# Patient Record
Sex: Male | Born: 1975 | Race: White | Hispanic: No | Marital: Single | Smoking: Never smoker
Health system: Southern US, Community
[De-identification: ages and names within clinical notes are randomized; demographics above are authoritative.]

## PROBLEM LIST (undated history)

## (undated) DIAGNOSIS — F419 Anxiety disorder, unspecified: Secondary | ICD-10-CM

## (undated) DIAGNOSIS — N200 Calculus of kidney: Secondary | ICD-10-CM

## (undated) HISTORY — PX: HERNIA REPAIR: SHX51

---

## 2010-10-28 ENCOUNTER — Emergency Department: Payer: Self-pay | Admitting: Emergency Medicine

## 2011-01-24 ENCOUNTER — Emergency Department: Payer: Self-pay | Admitting: Emergency Medicine

## 2018-04-20 ENCOUNTER — Ambulatory Visit
Admission: EM | Admit: 2018-04-20 | Discharge: 2018-04-20 | Disposition: A | Discharge status: Home / Self Care | Payer: 59 | Attending: Emergency Medicine | Admitting: Emergency Medicine

## 2018-04-20 ENCOUNTER — Other Ambulatory Visit: Payer: Self-pay

## 2018-04-20 ENCOUNTER — Encounter: Payer: Self-pay | Admitting: Emergency Medicine

## 2018-04-20 DIAGNOSIS — N200 Calculus of kidney: Secondary | ICD-10-CM | POA: Diagnosis not present

## 2018-04-20 DIAGNOSIS — N23 Unspecified renal colic: Secondary | ICD-10-CM | POA: Insufficient documentation

## 2018-04-20 HISTORY — DX: Anxiety disorder, unspecified: F41.9

## 2018-04-20 LAB — URINALYSIS, COMPLETE (UACMP) WITH MICROSCOPIC
BACTERIA UA: NONE SEEN
Glucose, UA: NEGATIVE mg/dL
KETONES UR: 15 mg/dL — AB
LEUKOCYTES UA: NEGATIVE
Nitrite: NEGATIVE
PH: 6 (ref 5.0–8.0)
Protein, ur: 30 mg/dL — AB
SQUAMOUS EPITHELIAL / LPF: NONE SEEN (ref 0–5)
Specific Gravity, Urine: 1.02 (ref 1.005–1.030)

## 2018-04-20 MED ORDER — IBUPROFEN 600 MG PO TABS: 600.0000 mg | ORAL_TABLET | Freq: Four times a day (QID) | ORAL | 0 refills | Status: DC | PRN

## 2018-04-20 MED ORDER — ONDANSETRON 8 MG PO TBDP: ORAL_TABLET | ORAL | 0 refills | Status: AC

## 2018-04-20 MED ORDER — HYDROCODONE-ACETAMINOPHEN 5-325 MG PO TABS: 1.0000 | ORAL_TABLET | Freq: Four times a day (QID) | ORAL | 0 refills | Status: DC | PRN

## 2018-04-20 MED ORDER — TAMSULOSIN HCL 0.4 MG PO CAPS: 0.4000 mg | ORAL_CAPSULE | Freq: Every day | ORAL | 0 refills | Status: AC

## 2018-04-20 NOTE — Discharge Instructions (Addendum)
Zofran for nausea and vomiting, take ibuprofen 600 mg with 1 g of Tylenol together 3 or 4 times a day as needed for pain, or ibuprofen 600 mg take with 1-2 Norco for severe pain.  Do not take Tylenol and Norco as we discussed.  Flomax, strain all urine, continue pushing fluids.

## 2018-04-20 NOTE — ED Provider Notes (Signed)
HPI  SUBJECTIVE:  Joseph Cantrell is a 42 y.o. male who presents with 4 to 5 days of intermittent left lower back pain described as a "punching," pain.  It lasts anywhere from seconds to hours.  He states that it started "near her spine" and is now moving anteriorly.  He states that the pain radiated down his flank into his groin today.  He reports cloudy, odorous urine, 1-2 episodes of hematuria in the past for 5 days.  He reports nausea and vomiting x3 when the pain is severe.  No fevers.  No pelvic or abdominal pain.  No urinary urgency or dysuria.  No back pain radiating down his legs.  He tried ibuprofen 800 mg every 6 hours without improvement in his symptoms.  Symptoms are better with walking, moving, worse with sitting still.  Is not associated with lifting, coughing, sneezing.  He has never had any symptoms like this before.  He is currently asymptomatic.  He states the pain is not getting worse, but it has been the same intensity since the beginning of the illness.  Family history significant for father with nephrolithiasis.  Past medical history negative for nephrolithiasis, UTI, diabetes.  He has a history of hypertension.  Did not take his medications today.  PMD: Dr. Quillian QuinceBliss.  Past Medical History:  Diagnosis Date  . Anxiety     Past Surgical History:  Procedure Laterality Date  . HERNIA REPAIR      Family History  Problem Relation Age of Onset  . Hypertension Mother   . Hypertension Father     Social History   Tobacco Use  . Smoking status: Never Smoker  . Smokeless tobacco: Current User    Types: Chew, Snuff  Substance Use Topics  . Alcohol use: Yes  . Drug use: Never    No current facility-administered medications for this encounter.   Current Outpatient Medications:  .  citalopram (CELEXA) 40 MG tablet, TAKE 1 TABLET BY MOUTH ONCE DAILY WITH FOOD AT THE START OF YOUR WORKDAY, Disp: , Rfl: 1 .  lamoTRIgine (LAMICTAL) 200 MG tablet, Take 200 mg by mouth daily., Disp:  , Rfl: 1 .  traZODone (DESYREL) 100 MG tablet, TAKE 1 TO 2 TABLETS BY MOUTH NIGHTLY AS NEEDED FOR SLEEP, Disp: , Rfl: 1 .  HYDROcodone-acetaminophen (NORCO/VICODIN) 5-325 MG tablet, Take 1-2 tablets by mouth every 6 (six) hours as needed for moderate pain., Disp: 12 tablet, Rfl: 0 .  ibuprofen (ADVIL,MOTRIN) 600 MG tablet, Take 1 tablet (600 mg total) by mouth every 6 (six) hours as needed., Disp: 30 tablet, Rfl: 0 .  ondansetron (ZOFRAN ODT) 8 MG disintegrating tablet, 1/2- 1 tablet q 8 hr prn nausea, vomiting, Disp: 20 tablet, Rfl: 0 .  tamsulosin (FLOMAX) 0.4 MG CAPS capsule, Take 1 capsule (0.4 mg total) by mouth at bedtime for 7 days., Disp: 7 capsule, Rfl: 0  Allergies  Allergen Reactions  . Sulfa Antibiotics Other (See Comments)    Unknown     ROS  As noted in HPI.   Physical Exam  BP (S) (!) 166/90 (BP Location: Left Arm)   Pulse 88   Temp 98.2 F (36.8 C) (Oral)   Resp 16   Ht 5\' 9"  (1.753 m)   Wt 79.4 kg   SpO2 100%   BMI 25.84 kg/m   Constitutional: Well developed, well nourished, no acute distress Eyes:  EOMI, conjunctiva normal bilaterally HENT: Normocephalic, atraumatic,mucus membranes moist Respiratory: Normal inspiratory effort Cardiovascular: Normal rate GI: nondistended.  Soft, nontender.  No suprapubic, flank tenderness. Back: No CVAT.  No paralumbar tenderness.  No bony L-spine tenderness.  No muscle spasm. skin: No rash, skin intact Musculoskeletal: no deformities Neurologic: Alert & oriented x 3, no focal neuro deficits Psychiatric: Speech and behavior appropriate   ED Course   Medications - No data to display  Orders Placed This Encounter  Procedures  . Urinalysis, Complete w Microscopic    Standing Status:   Standing    Number of Occurrences:   1  . Strain all urine    Standing Status:   Standing    Number of Occurrences:   1    Results for orders placed or performed during the hospital encounter of 04/20/18 (from the past 24 hour(s))   Urinalysis, Complete w Microscopic     Status: Abnormal   Collection Time: 04/20/18  6:00 PM  Result Value Ref Range   Color, Urine YELLOW YELLOW   APPearance CLEAR CLEAR   Specific Gravity, Urine 1.020 1.005 - 1.030   pH 6.0 5.0 - 8.0   Glucose, UA NEGATIVE NEGATIVE mg/dL   Hgb urine dipstick MODERATE (A) NEGATIVE   Bilirubin Urine SMALL (A) NEGATIVE   Ketones, ur 15 (A) NEGATIVE mg/dL   Protein, ur 30 (A) NEGATIVE mg/dL   Nitrite NEGATIVE NEGATIVE   Leukocytes, UA NEGATIVE NEGATIVE   Squamous Epithelial / LPF NONE SEEN 0 - 5   WBC, UA 0-5 0 - 5 WBC/hpf   RBC / HPF 21-50 0 - 5 RBC/hpf   Bacteria, UA NONE SEEN NONE SEEN   No results found.  ED Clinical Impression  Nephrolithiasis  Renal colic on left side   ED Assessment/Plan  UA positive for moderate hematuria, small bili, ketones and proteinuria.  No bacteria seen. Suspect nephrolithiasis.  Discussed the possibility of doing a CT to evaluate for kidney stone, but he has opted to try treatment first, and if he is getting worse, he will go to the ER and get imaging at that time.  [place] Narcotic database reviewed for this patient, and feel that the risk/benefit ratio today is favorable for proceeding with a prescription for controlled substance.  No opiate prescriptions in 2 years.  Plan to send home with Zofran, ibuprofen 600 mg to take with 1 g of Tylenol together 3 or 4 times a day as needed for pain, or ibuprofen 600 mg take with 1-2 Norco for severe pain.  Flomax, strainer, follow-up with urology.  Dr. Apolinar Junes on call.    Discussed labs, MDM, treatment plan, and plan for follow-up with patient. Discussed sn/sx that should prompt return to the ED. patient agrees with plan.   Meds ordered this encounter  Medications  . tamsulosin (FLOMAX) 0.4 MG CAPS capsule    Sig: Take 1 capsule (0.4 mg total) by mouth at bedtime for 7 days.    Dispense:  7 capsule    Refill:  0  . ibuprofen (ADVIL,MOTRIN) 600 MG tablet    Sig: Take 1  tablet (600 mg total) by mouth every 6 (six) hours as needed.    Dispense:  30 tablet    Refill:  0  . HYDROcodone-acetaminophen (NORCO/VICODIN) 5-325 MG tablet    Sig: Take 1-2 tablets by mouth every 6 (six) hours as needed for moderate pain.    Dispense:  12 tablet    Refill:  0  . ondansetron (ZOFRAN ODT) 8 MG disintegrating tablet    Sig: 1/2- 1 tablet q 8 hr prn nausea, vomiting  Dispense:  20 tablet    Refill:  0    *This clinic note was created using Scientist, clinical (histocompatibility and immunogenetics). Therefore, there may be occasional mistakes despite careful proofreading.   ?    Domenick Gong, MD 04/20/18 2031

## 2018-04-20 NOTE — ED Triage Notes (Signed)
Patient c/o left sided flank pain and increase in urinary urgency since Friday.

## 2018-05-30 ENCOUNTER — Other Ambulatory Visit: Payer: Self-pay

## 2018-05-30 ENCOUNTER — Ambulatory Visit
Admission: EM | Admit: 2018-05-30 | Discharge: 2018-05-30 | Disposition: A | Discharge status: Home / Self Care | Payer: 59 | Attending: Family Medicine | Admitting: Family Medicine

## 2018-05-30 DIAGNOSIS — R310 Gross hematuria: Secondary | ICD-10-CM | POA: Diagnosis not present

## 2018-05-30 DIAGNOSIS — R109 Unspecified abdominal pain: Secondary | ICD-10-CM | POA: Diagnosis not present

## 2018-05-30 HISTORY — DX: Calculus of kidney: N20.0

## 2018-05-30 LAB — URINALYSIS, COMPLETE (UACMP) WITH MICROSCOPIC
Bilirubin Urine: NEGATIVE
Glucose, UA: NEGATIVE mg/dL
Ketones, ur: 15 mg/dL — AB
Nitrite: NEGATIVE
PH: 6.5 (ref 5.0–8.0)
Protein, ur: NEGATIVE mg/dL
Specific Gravity, Urine: 1.025 (ref 1.005–1.030)
Squamous Epithelial / HPF: NONE SEEN (ref 0–5)

## 2018-05-30 MED ORDER — HYDROCODONE-ACETAMINOPHEN 5-325 MG PO TABS: 1.0000 | ORAL_TABLET | Freq: Four times a day (QID) | ORAL | 0 refills | Status: AC | PRN

## 2018-05-30 MED ORDER — TAMSULOSIN HCL 0.4 MG PO CAPS: 0.4000 mg | ORAL_CAPSULE | Freq: Every morning | ORAL | 0 refills | Status: AC

## 2018-05-30 NOTE — ED Provider Notes (Signed)
[facility]    CSN: [id number] Arrival date & time: 05/30/18  1510     History   Chief Complaint Chief Complaint  Patient presents with  . Flank Pain    HPI Joseph Cantrell is a 43 y.o. male.   HPI 43 year old male presents with left-sided flank pain started on Thursday 4 days prior to this visit.  This morning it was very severe pain mostly in his low back with mild radiation into the flank.  Seen one other time in April 20, 2018 where it was suspected that he had a kidney stone.  And was not available at that time and he opted to have the stone passed spontaneously.  Not been bothered with the symptoms until present time.  He states that the pain is exactly as it was before.  Have an episode of gross hematuria on Thursday that has since resolved.  He has had no fever or chills.  He has some nausea but no vomiting.  At the present time his pain has subsided .         Past Medical History:  Diagnosis Date  . Anxiety   . Kidney stones     There are no active problems to display for this patient.   Past Surgical History:  Procedure Laterality Date  . HERNIA REPAIR         Home Medications    Prior to Admission medications   Medication Sig Start Date End Date Taking? Authorizing Provider  citalopram (CELEXA) 40 MG tablet TAKE 1 TABLET BY MOUTH ONCE DAILY WITH FOOD AT THE START OF YOUR WORKDAY 03/29/18   [provider]  HYDROcodone-acetaminophen (NORCO/VICODIN) 5-325 MG tablet Take 1-2 tablets by mouth every 6 (six) hours as needed for moderate pain. 04/20/18   Domenick GongMortenson, Ashley, MD  HYDROcodone-acetaminophen (NORCO/VICODIN) 5-325 MG tablet Take 1-2 tablets by mouth every 6 (six) hours as needed. 05/30/18   Lutricia Feiloemer, Savhanna Sliva P, PA-C  ibuprofen (ADVIL,MOTRIN) 600 MG tablet Take 1 tablet (600 mg total) by mouth every 6 (six) hours as needed. 04/20/18   Domenick GongMortenson, Ashley, MD  lamoTRIgine (LAMICTAL) 200 MG tablet Take 200 mg by mouth daily. 03/29/18    [provider]  ondansetron (ZOFRAN ODT) 8 MG disintegrating tablet 1/2- 1 tablet q 8 hr prn nausea, vomiting 04/20/18   Domenick GongMortenson, Ashley, MD  tamsulosin (FLOMAX) 0.4 MG CAPS capsule Take 1 capsule (0.4 mg total) by mouth every morning. 05/30/18   Lutricia Feiloemer, Alexsis Kathman P, PA-C  traZODone (DESYREL) 100 MG tablet TAKE 1 TO 2 TABLETS BY MOUTH NIGHTLY AS NEEDED FOR SLEEP 03/29/18   [provider]    Family History Family History  Problem Relation Age of Onset  . Hypertension Mother   . Hypertension Father     Social History Social History   Tobacco Use  . Smoking status: Never Smoker  . Smokeless tobacco: Current User    Types: Chew, Snuff  Substance Use Topics  . Alcohol use: Yes    Comment: social  . Drug use: Never     Allergies   Sulfa antibiotics   Review of Systems Review of Systems  Constitutional: Positive for activity change. Negative for chills, fatigue and fever.  Genitourinary: Positive for flank pain and hematuria.  All other systems reviewed and are negative.    Physical Exam Triage Vital Signs ED Triage Vitals  Enc Vitals Group     BP 05/30/18 1538 (!) 153/104     Pulse Rate 05/30/18 1538 84  Resp 05/30/18 1538 16     Temp 05/30/18 1538 97.9 F (36.6 C)     Temp Source 05/30/18 1538 Oral     SpO2 05/30/18 1538 100 %     Weight 05/30/18 1539 175 lb 0.7 oz (79.4 kg)     Height 05/30/18 1539 5\' 9"  (1.753 m)     Head Circumference --      Peak Flow --      Pain Score 05/30/18 1539 4     Pain Loc --      Pain Edu? --      Excl. in GC? --    No data found.  Updated Vital Signs BP (!) 153/104 (BP Location: Left Arm)   Pulse 84   Temp 97.9 F (36.6 C) (Oral)   Resp 16   Ht 5\' 9"  (1.753 m)   Wt 175 lb 0.7 oz (79.4 kg)   SpO2 100%   BMI 25.85 kg/m   Visual Acuity Right Eye Distance:   Left Eye Distance:   Bilateral Distance:    Right Eye Near:   Left Eye Near:    Bilateral Near:     Physical Exam Vitals signs and  nursing note reviewed.  Constitutional:      General: He is not in acute distress.    Appearance: Normal appearance. He is normal weight. He is not ill-appearing, toxic-appearing or diaphoretic.  HENT:     Head: Normocephalic.     Nose: Nose normal.  Eyes:     General:        Right eye: No discharge.        Left eye: No discharge.     Conjunctiva/sclera: Conjunctivae normal.  Neck:     Musculoskeletal: Normal range of motion and neck supple.  Pulmonary:     Effort: Pulmonary effort is normal.     Breath sounds: Normal breath sounds.  Abdominal:     General: Bowel sounds are normal.     Palpations: Abdomen is soft.     Tenderness: There is no right CVA tenderness or left CVA tenderness.  Musculoskeletal: Normal range of motion.  Skin:    General: Skin is warm and dry.  Neurological:     General: No focal deficit present.     Mental Status: He is alert and oriented to person, place, and time.  Psychiatric:        Mood and Affect: Mood normal.        Behavior: Behavior normal.        Thought Content: Thought content normal.        Judgment: Judgment normal.      UC Treatments / Results  Labs (all labs ordered are listed, but only abnormal results are displayed) Labs Reviewed  URINALYSIS, COMPLETE (UACMP) WITH MICROSCOPIC - Abnormal; Notable for the following components:      Result Value   Hgb urine dipstick MODERATE (*)    Ketones, ur 15 (*)    Leukocytes, UA TRACE (*)    Bacteria, UA RARE (*)    All other components within normal limits    EKG None  Radiology No results found.  Procedures Procedures (including critical care time)  Medications Ordered in UC Medications - No data to display  Initial Impression / Assessment and Plan / UC Course  I have reviewed the triage vital signs and the nursing notes.  Pertinent labs & imaging results that were available during my care of the patient were reviewed by me and  considered in my medical decision making (see  chart for details).   The patient that this is his second attack without definitive diagnosis.  Obtain a CAT scan which is not available today but will be tomorrow as an outpatient.  To be scheduled for him tomorrow.  Prescription for Flomax and also for Vicodin if the pain does return.  He is going to screen all of his urine.  If he has worsening pain tonight he should go to the emergency room.   Final Clinical Impressions(s) / UC Diagnoses   Final diagnoses:  Left flank pain  Gross hematuria   Discharge Instructions   None    ED Prescriptions    Medication Sig Dispense Auth. Provider   tamsulosin (FLOMAX) 0.4 MG CAPS capsule Take 1 capsule (0.4 mg total) by mouth every morning. 30 capsule Lutricia Feil, PA-C   HYDROcodone-acetaminophen (NORCO/VICODIN) 5-325 MG tablet Take 1-2 tablets by mouth every 6 (six) hours as needed. 10 tablet Lutricia Feil, PA-C     Controlled Substance Prescriptions [place] Controlled Substance Registry consulted? Not Applicable   Lutricia Feil, PA-C 05/30/18 2126

## 2018-05-30 NOTE — Discharge Instructions (Addendum)
Drink plenty of fluids.  Screen all of your urine.  Go to the emergency room if your pain becomes unbearable.

## 2018-05-30 NOTE — ED Triage Notes (Signed)
Pt with left sided flank pain starting Thursday and some nausea. "It's a kidney stone."

## 2018-05-31 ENCOUNTER — Encounter (INDEPENDENT_AMBULATORY_CARE_PROVIDER_SITE_OTHER): Payer: Self-pay

## 2018-05-31 ENCOUNTER — Ambulatory Visit
Admission: RE | Admit: 2018-05-31 | Discharge: 2018-05-31 | Disposition: A | Discharge status: Home / Self Care | Payer: 59 | Source: Ambulatory Visit | Attending: Emergency Medicine | Admitting: Emergency Medicine

## 2018-05-31 DIAGNOSIS — N1330 Unspecified hydronephrosis: Secondary | ICD-10-CM | POA: Diagnosis not present

## 2018-05-31 DIAGNOSIS — K769 Liver disease, unspecified: Secondary | ICD-10-CM | POA: Diagnosis not present

## 2018-05-31 DIAGNOSIS — N201 Calculus of ureter: Secondary | ICD-10-CM | POA: Diagnosis not present

## 2018-05-31 DIAGNOSIS — R109 Unspecified abdominal pain: Secondary | ICD-10-CM | POA: Diagnosis not present

## 2018-06-02 ENCOUNTER — Other Ambulatory Visit: Payer: Self-pay

## 2018-06-02 DIAGNOSIS — N201 Calculus of ureter: Secondary | ICD-10-CM | POA: Insufficient documentation

## 2018-06-02 DIAGNOSIS — N200 Calculus of kidney: Secondary | ICD-10-CM

## 2018-06-02 NOTE — Progress Notes (Signed)
06/03/2018 12:40 PM   Tawnya Crook 06-10-1975 [id number]  Referring provider: Dortha Kern, MD [address] [place], [place] [address]  Chief Complaint  Patient presents with  . Nephrolithiasis    New Patient     HPI: 43 year old male presents today for further evaluation of a 6 mm left proximal/ mid ureteral calculus.  Patient was recently seen and evaluated at [facility] urgent care on 05/30/2018 with severe acute onset left flank pain.  Was associated with nausea without vomiting.  No dysuria or urinary symptoms.  He underwent a CT scan on 05/31/2018 during that same encounter which showed a 6 mm left proximal ureteral calculus with proximal hydroureteronephrosis.  No additional blood work was done on the day other than a urinalysis which showed red blood cells, otherwise bland appearing urine.  He was discharged home with Flomax, urinary strainer, and 10 tablets of Vicodin.  He is continued to have intermittent left flank pain, as recently as yesterday evening.  The pain is slightly lower than previously but has not migrated to his left lower quadrant or testicle.  He has not seen a stone pass.  He is not strained on every occasion.  Notably, the patient was seen and evaluated in urgent care on 04/20/2018 with similar symptoms which he felt was secondary to a muscle strain.  No imaging was done at that time.  He did have blood in his urine on that occasion as well thus presumably this was a stone episode.  He has no previous history of kidney stones.  He denies any current fevers, chills, nausea, vomiting, severely uncontrolled pain, dysuria, or any other signs or symptoms of infection.  PMH: Past Medical History:  Diagnosis Date  . Anxiety   . Kidney stones     Surgical History: Past Surgical History:  Procedure Laterality Date  . HERNIA REPAIR      Home Medications:  Allergies as of 06/03/2018      Reactions   Sulfa Antibiotics Other (See Comments)   Unknown      Medication List       Accurate as of June 03, 2018 12:40 PM. Always use your most recent med list.        citalopram 40 MG tablet Commonly known as:  CELEXA TAKE 1 TABLET BY MOUTH ONCE DAILY WITH FOOD AT THE START OF YOUR WORKDAY   HYDROcodone-acetaminophen 5-325 MG tablet Commonly known as:  NORCO/VICODIN Take 1-2 tablets by mouth every 6 (six) hours as needed.   lamoTRIgine 200 MG tablet Commonly known as:  LAMICTAL Take 200 mg by mouth daily.   ondansetron 8 MG disintegrating tablet Commonly known as:  ZOFRAN ODT 1/2- 1 tablet q 8 hr prn nausea, vomiting   tamsulosin 0.4 MG Caps capsule Commonly known as:  FLOMAX Take 1 capsule (0.4 mg total) by mouth every morning.   traZODone 100 MG tablet Commonly known as:  DESYREL TAKE 1 TO 2 TABLETS BY MOUTH NIGHTLY AS NEEDED FOR SLEEP       Allergies:  Allergies  Allergen Reactions  . Sulfa Antibiotics Other (See Comments)    Unknown    Family History: Family History  Problem Relation Age of Onset  . Hypertension Mother   . Hypertension Father     Social History:  reports that he has never smoked. His smokeless tobacco use includes chew and snuff. He reports current alcohol use. He reports that he does not use drugs.  ROS: UROLOGY Frequent Urination?: No Hard to postpone  urination?: No Burning/pain with urination?: No Get up at night to urinate?: No Leakage of urine?: No Urine stream starts and stops?: No Trouble starting stream?: No Do you have to strain to urinate?: No Blood in urine?: Yes Urinary tract infection?: No Sexually transmitted disease?: No Injury to kidneys or bladder?: No Painful intercourse?: No Weak stream?: No Erection problems?: No Penile pain?: No  Gastrointestinal Nausea?: Yes Vomiting?: No Indigestion/heartburn?: No Diarrhea?: No Constipation?: No  Constitutional Fever: No Night sweats?: No Weight loss?: No Fatigue?: No  Skin Skin rash/lesions?: No Itching?:  No  Eyes Blurred vision?: No Double vision?: No  Ears/Nose/Throat Sore throat?: No Sinus problems?: No  Hematologic/Lymphatic Swollen glands?: No Easy bruising?: No  Cardiovascular Leg swelling?: No Chest pain?: No  Respiratory Cough?: No Shortness of breath?: No  Endocrine Excessive thirst?: No  Musculoskeletal Back pain?: Yes Joint pain?: No  Neurological Headaches?: No Dizziness?: No  Psychologic Depression?: No Anxiety?: Yes  Physical Exam: BP (!) 152/86   Pulse 86   Ht 5\' 5"  (1.651 m)   Wt 175 lb (79.4 kg)   BMI 29.12 kg/m   Constitutional:  Alert and oriented, No acute distress. HEENT: [place] AT, moist mucus membranes.  Trachea midline, no masses. Cardiovascular: No clubbing, cyanosis, or edema. Respiratory: Normal respiratory effort, no increased work of breathing. GI: Abdomen is soft, nontender, nondistended, no abdominal masses GU: No CVA tenderness Skin: No rashes, bruises or suspicious lesions. Neurologic: Grossly intact, no focal deficits, moving all 4 extremities. Psychiatric: Normal mood and affect.  Laboratory Data: No recent labs  Urinalysis    Component Value Date/Time   COLORURINE YELLOW 06/03/2018 0934   APPEARANCEUR CLEAR 06/03/2018 0934   LABSPEC 1.025 06/03/2018 0934   PHURINE 7.0 06/03/2018 0934   GLUCOSEU NEGATIVE 06/03/2018 0934   HGBUR NEGATIVE 06/03/2018 0934   BILIRUBINUR NEGATIVE 06/03/2018 0934   KETONESUR NEGATIVE 06/03/2018 0934   PROTEINUR NEGATIVE 06/03/2018 0934   NITRITE NEGATIVE 06/03/2018 0934   LEUKOCYTESUR NEGATIVE 06/03/2018 0934    Lab Results  Component Value Date   BACTERIA NONE SEEN 06/03/2018    Pertinent Imaging: Results for orders placed during the hospital encounter of 05/31/18  CT RENAL STONE STUDY   Narrative CLINICAL DATA:  Left flank pain since Thursday.  EXAM: CT ABDOMEN AND PELVIS WITHOUT CONTRAST  TECHNIQUE: Multidetector CT imaging of the abdomen and pelvis was  performed following the standard protocol without IV contrast.  COMPARISON:  None.  FINDINGS: Lower chest: No acute abnormality.  Hepatobiliary: 1.9 cm low-density lesion is identified in the dome of liver probably a cyst but evaluation is limited without contrast. The liver is otherwise normal. The gallbladder is normal. The biliary tree is normal.  Pancreas: Unremarkable. No pancreatic ductal dilatation or surrounding inflammatory changes.  Spleen: Normal in size without focal abnormality.  Adrenals/Urinary Tract: The bilateral adrenal glands are normal. There are nonobstructing stones in bilateral kidneys. There is moderate left hydronephrosis due to obstruction by 6 mm stone in the proximal left ureter. The bladder is normal.  Stomach/Bowel: Stomach is within normal limits. Appendix appears normal. No evidence of bowel wall thickening, distention, or inflammatory changes. There is diverticulosis of colon without diverticulitis.  Vascular/Lymphatic: No significant vascular findings are present. No enlarged abdominal or pelvic lymph nodes.  Reproductive: Prostate calcifications are noted.  Other: Minimal umbilical herniation of mesenteric fat is noted.  Musculoskeletal: No acute abnormality.  IMPRESSION: Left hydronephrosis due to obstruction by 6 mm stone in the proximal left ureter.  Low-density  lesion in the dome of liver probably a cyst but evaluation is limited without contrast.   Electronically Signed   By: Sherian Rein M.D.   On: 05/31/2018 11:20    Ct scan personally reviewed, stone easily visible on [facility] imaging.      Assessment & Plan:    1. Left ureteral calculus 6 mm left proximal ureteral calculus  We discussed that in all likelihood, the stone was present from the same stone from 04/20/2018 thus its been over 30 days.  We discussed that continued observation is one option however after 30 days, the likelihood of passing this do not put  spontaneously dramatically decreases.  We discussed various treatment options including ESWL vs. ureteroscopy, laser lithotripsy, and stent. We discussed the risks and benefits of both including bleeding, infection, damage to surrounding structures, efficacy with need for possible further intervention, and need for temporary ureteral stent.  At this point time, he would like another 2 weeks to try to pass the stone.  He is given an additional urinary strainer that he can use on the go.  We reviewed warning symptoms in detail.  Indications for more urgent/emergent evaluation were reviewed.  Given a prescription for an additional 10 tablets of Vicodin to last him until the stone passes or is follow-up.   2. Hydronephrosis, left Compared to #1  3. Left flank pain As above   Return in about 2 weeks (around 06/17/2018) for KUB.  Vanna Scotland, MD  [facility] [address], [address] [place], [place] [address] [phone]

## 2018-06-03 ENCOUNTER — Encounter: Payer: Self-pay | Admitting: Urology

## 2018-06-03 ENCOUNTER — Ambulatory Visit (INDEPENDENT_AMBULATORY_CARE_PROVIDER_SITE_OTHER): Payer: 59 | Admitting: Urology

## 2018-06-03 ENCOUNTER — Other Ambulatory Visit
Admission: RE | Admit: 2018-06-03 | Discharge: 2018-06-03 | Disposition: A | Discharge status: Home / Self Care | Payer: 59 | Attending: Urology | Admitting: Urology

## 2018-06-03 VITALS — BP 152/86 | HR 86 | Ht 65.0 in | Wt 175.0 lb

## 2018-06-03 DIAGNOSIS — N201 Calculus of ureter: Secondary | ICD-10-CM | POA: Diagnosis not present

## 2018-06-03 DIAGNOSIS — N1330 Unspecified hydronephrosis: Secondary | ICD-10-CM

## 2018-06-03 DIAGNOSIS — N200 Calculus of kidney: Secondary | ICD-10-CM | POA: Insufficient documentation

## 2018-06-03 DIAGNOSIS — R109 Unspecified abdominal pain: Secondary | ICD-10-CM

## 2018-06-03 LAB — URINALYSIS, COMPLETE (UACMP) WITH MICROSCOPIC
Bacteria, UA: NONE SEEN
Bilirubin Urine: NEGATIVE
Glucose, UA: NEGATIVE mg/dL
Hgb urine dipstick: NEGATIVE
Ketones, ur: NEGATIVE mg/dL
Leukocytes, UA: NEGATIVE
Nitrite: NEGATIVE
PROTEIN: NEGATIVE mg/dL
RBC / HPF: NONE SEEN RBC/hpf (ref 0–5)
Specific Gravity, Urine: 1.025 (ref 1.005–1.030)
Squamous Epithelial / HPF: NONE SEEN (ref 0–5)
pH: 7 (ref 5.0–8.0)

## 2018-06-03 MED ORDER — HYDROCODONE-ACETAMINOPHEN 5-325 MG PO TABS: 1.0000 | ORAL_TABLET | Freq: Four times a day (QID) | ORAL | 0 refills | Status: AC | PRN

## 2018-06-24 ENCOUNTER — Ambulatory Visit: Payer: 59 | Admitting: Urology

## 2020-09-02 IMAGING — CT CT RENAL STONE PROTOCOL
1 of 2 series · 15 of 32 positions shown, 19 images · non-contrast
Comparison: None.

CLINICAL DATA: Left flank pain since [REDACTED].

EXAM:
CT ABDOMEN AND PELVIS WITHOUT CONTRAST
TECHNIQUE: Multidetector CT imaging of the abdomen and pelvis was performed
following the standard protocol without IV contrast.

[Series 2: axial st · axial · 0.73mm/px · z∈[-980,-505]mm · 15 of 105 slices shown, 19 images]
[im 5/105  soft-tissue]
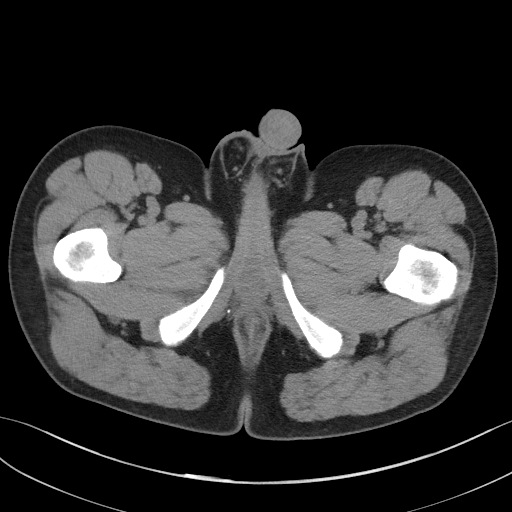
[im 5/105  bone]
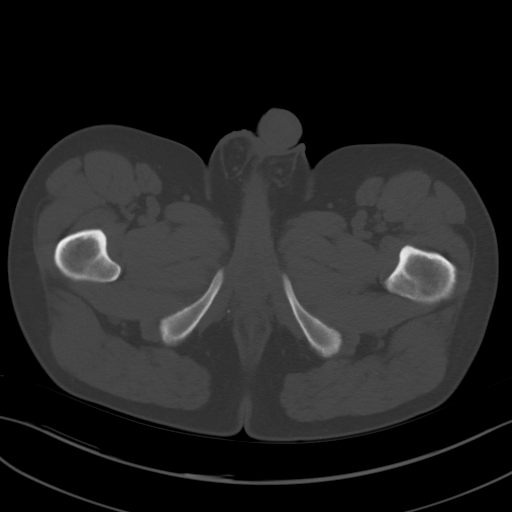
[im 14/105  soft-tissue]
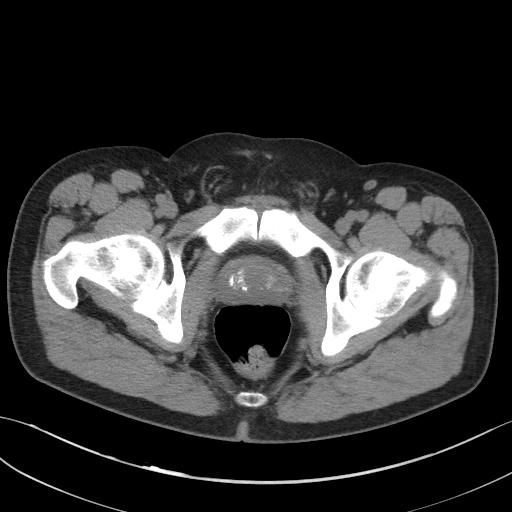
[im 22/105  soft-tissue]
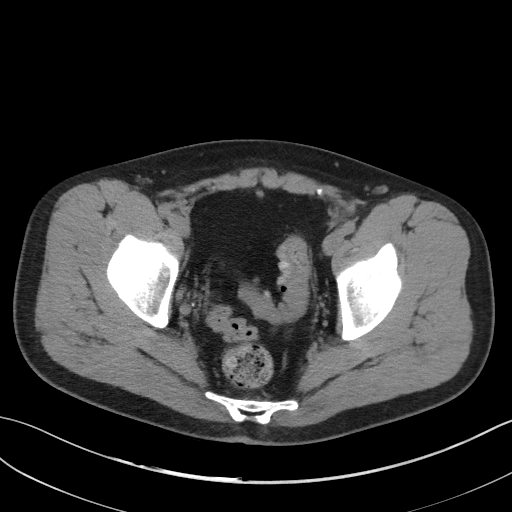
[im 31/105  soft-tissue]
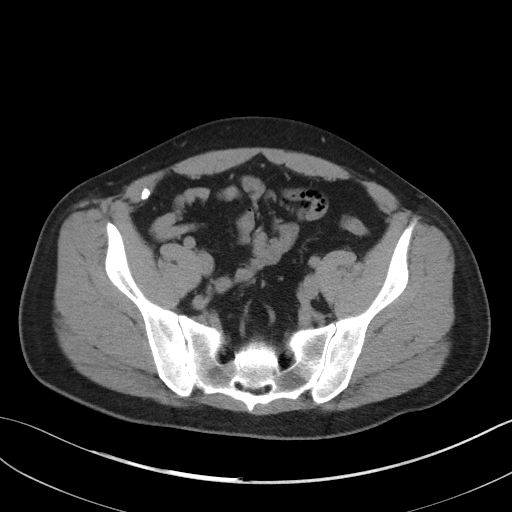
[im 35/105  soft-tissue]
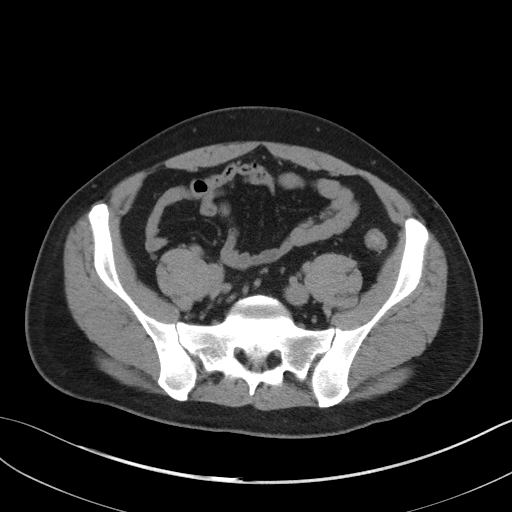
[im 44/105  soft-tissue]
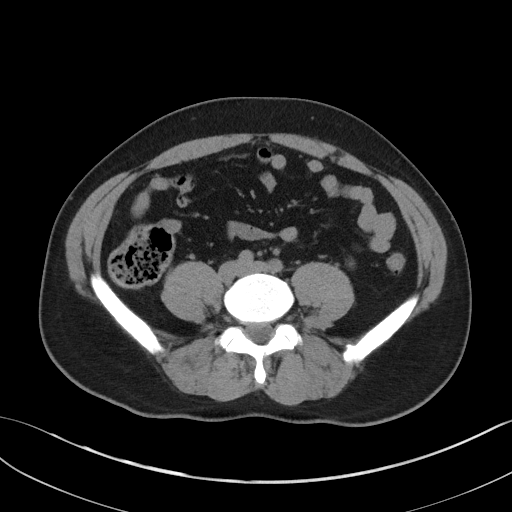
[im 53/105  soft-tissue]
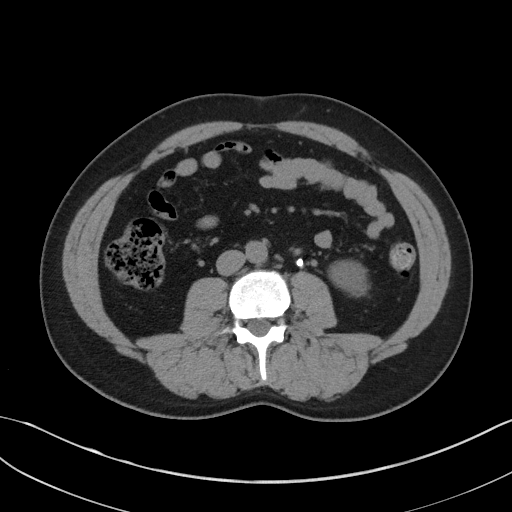
[im 61/105  soft-tissue]
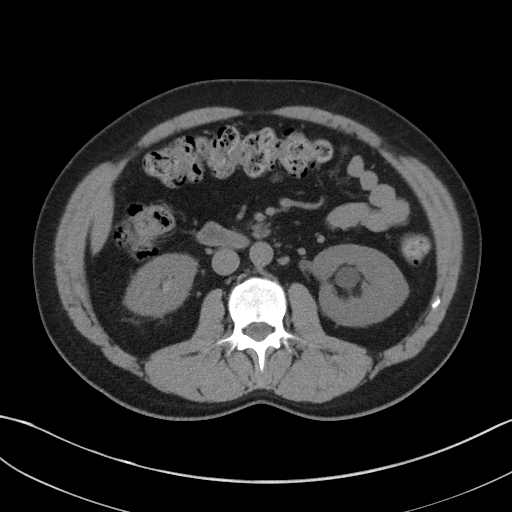
[im 70/105  soft-tissue]
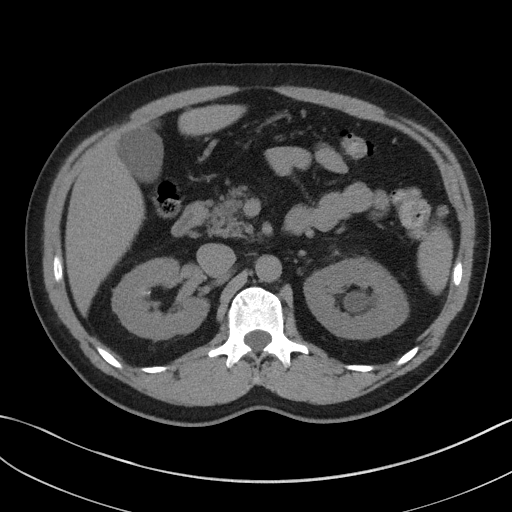
[im 70/105  bone]
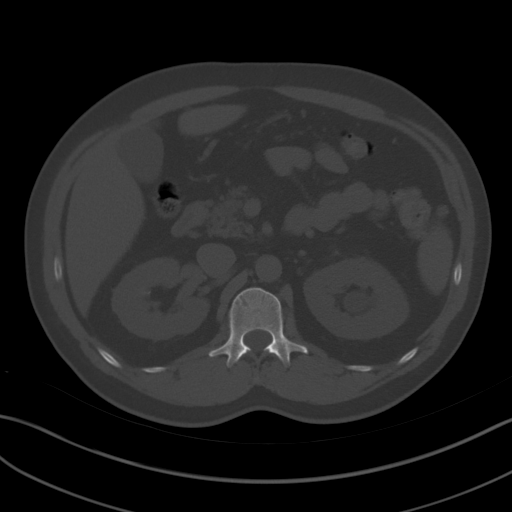
[im 74/105  soft-tissue]
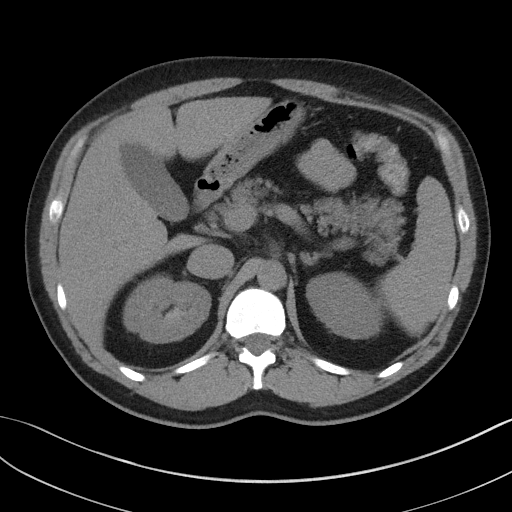
[im 83/105  soft-tissue]
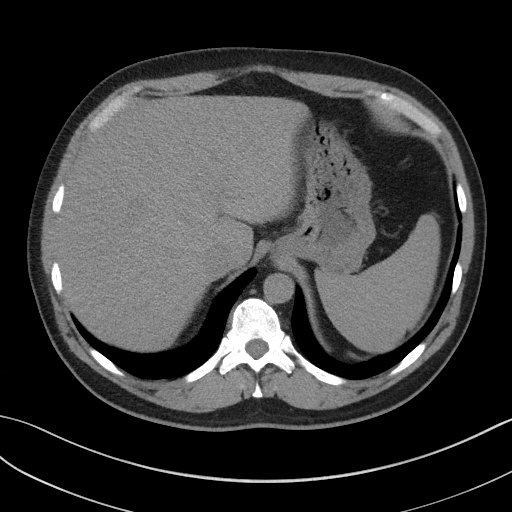
[im 87/105  lung]
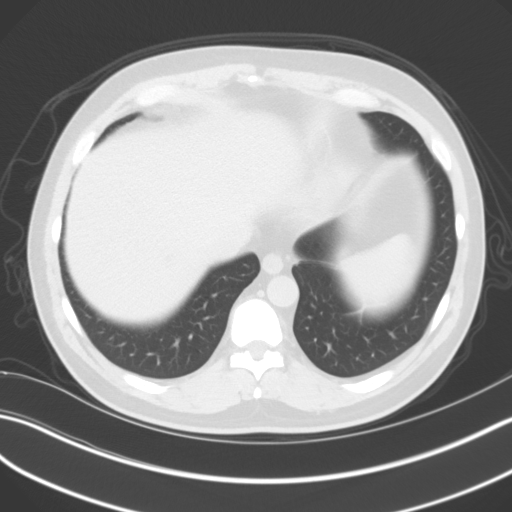
[im 92/105  soft-tissue]
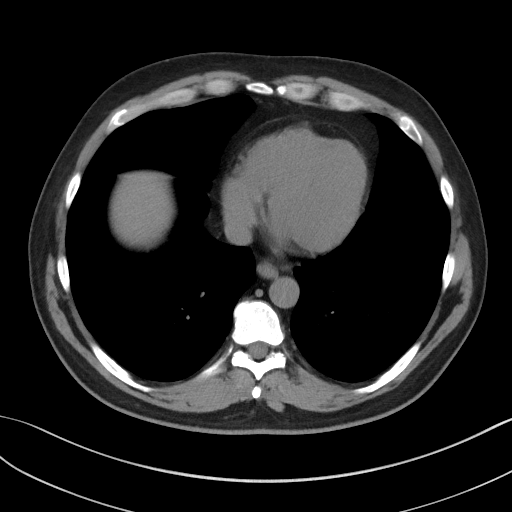
[im 92/105  lung]
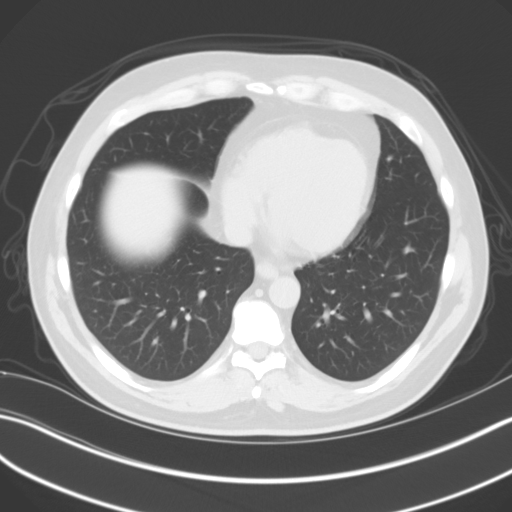
[im 96/105  lung]
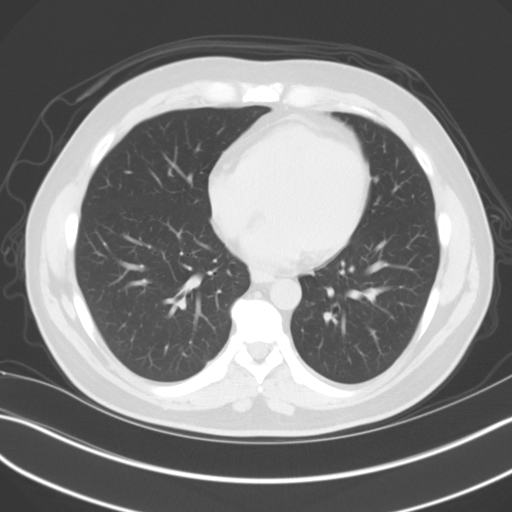
[im 100/105  soft-tissue]
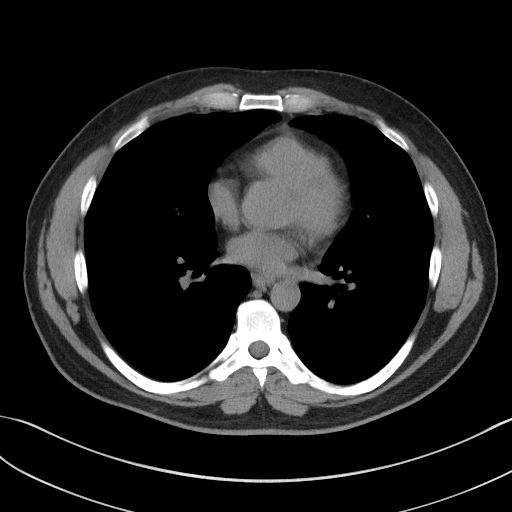
[im 100/105  lung]
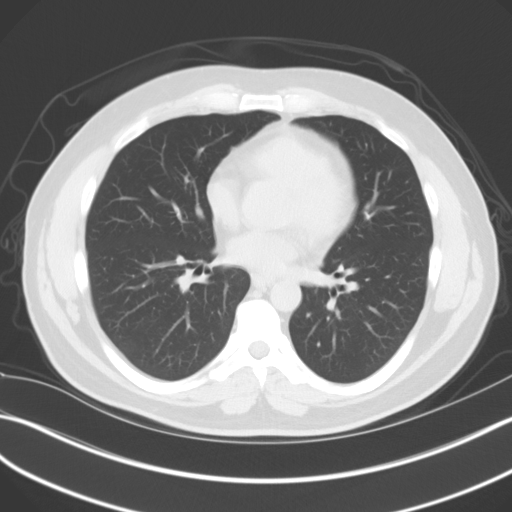

[15 of 32 positions shown; findings below may reference images not displayed]

FINDINGS: Lower chest: No acute abnormality.

Hepatobiliary: 1.9 cm low-density lesion is identified in the dome
of liver probably a cyst but evaluation is limited without contrast.
The liver is otherwise normal. The gallbladder is normal. The
biliary tree is normal.

Pancreas: Unremarkable. No pancreatic ductal dilatation or
surrounding inflammatory changes.

Spleen: Normal in size without focal abnormality.

Adrenals/Urinary Tract: The bilateral adrenal glands are normal.
There are nonobstructing stones in bilateral kidneys. There is
moderate left hydronephrosis due to obstruction by 6 mm stone in the
proximal left ureter. The bladder is normal.

Stomach/Bowel: Stomach is within normal limits. Appendix appears
normal. No evidence of bowel wall thickening, distention, or
inflammatory changes. There is diverticulosis of colon without
diverticulitis.

Vascular/Lymphatic: No significant vascular findings are present. No
enlarged abdominal or pelvic lymph nodes.

Reproductive: Prostate calcifications are noted.

Other: Minimal umbilical herniation of mesenteric fat is noted.

Musculoskeletal: No acute abnormality.
IMPRESSION: Left hydronephrosis due to obstruction by 6 mm stone in the proximal
left ureter.

Low-density lesion in the dome of liver probably a cyst but
evaluation is limited without contrast.
